# Patient Record
Sex: Male | Born: 1990 | Race: White | Hispanic: No | Marital: Married | Smoking: Current some day smoker
Health system: Southern US, Community
[De-identification: ages and names within clinical notes are randomized; demographics above are authoritative.]

## PROBLEM LIST (undated history)

## (undated) DIAGNOSIS — J45909 Unspecified asthma, uncomplicated: Secondary | ICD-10-CM

## (undated) DIAGNOSIS — F419 Anxiety disorder, unspecified: Secondary | ICD-10-CM

## (undated) DIAGNOSIS — R51 Headache: Secondary | ICD-10-CM

## (undated) DIAGNOSIS — R519 Headache, unspecified: Secondary | ICD-10-CM

## (undated) DIAGNOSIS — M549 Dorsalgia, unspecified: Secondary | ICD-10-CM

## (undated) HISTORY — DX: Anxiety disorder, unspecified: F41.9

## (undated) HISTORY — DX: Headache: R51

## (undated) HISTORY — DX: Unspecified asthma, uncomplicated: J45.909

## (undated) HISTORY — DX: Headache, unspecified: R51.9

---
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
## 2014-06-22 DIAGNOSIS — Z0000 Encounter for general adult medical examination without abnormal findings: Secondary | ICD-10-CM | POA: Insufficient documentation

## 2014-06-22 DIAGNOSIS — J309 Allergic rhinitis, unspecified: Secondary | ICD-10-CM | POA: Insufficient documentation

## 2014-08-25 ENCOUNTER — Ambulatory Visit (HOSPITAL_COMMUNITY): Payer: Self-pay | Admitting: Physician Assistant

## 2014-09-02 ENCOUNTER — Encounter (HOSPITAL_COMMUNITY): Payer: Self-pay | Admitting: Medical

## 2014-09-02 ENCOUNTER — Ambulatory Visit (INDEPENDENT_AMBULATORY_CARE_PROVIDER_SITE_OTHER): Payer: BLUE CROSS/BLUE SHIELD | Admitting: Medical

## 2014-09-02 VITALS — BP 120/70 | HR 71 | Ht 67.0 in | Wt 200.0 lb

## 2014-09-02 DIAGNOSIS — F900 Attention-deficit hyperactivity disorder, predominantly inattentive type: Secondary | ICD-10-CM

## 2014-09-02 MED ORDER — LISDEXAMFETAMINE DIMESYLATE 20 MG PO CAPS: 20.0000 mg | ORAL_CAPSULE | Freq: Every day | ORAL | Status: DC

## 2014-09-02 NOTE — Progress Notes (Signed)
Psychiatric Assessment Adult  Patient Identification:  Nicolas Cohen Date of Evaluation:  09/02/2014 Chief Complaint: "Not being able to focus" History of Chief Complaint:   Chief Complaint  Patient presents with  . Establish Care  . ADD    HPI 24 yo Hispanic male referred by Helene Kelp PA-C  [facility] after c/o problems with focus especially at work upon his annual wellness exam.Pt reports that problem began in grammar school and has gotten worse over last 10 years.His sister was tested in Middle School and was positive.His father is also + for ADD he reports.The problem is aggravated by tasks requiring attention to detail;tasks that are complicated and 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  Review of Systems  Constitutional Symptoms      Denies fever, chills, night sweats, weight loss, weight gain, and fatigue.   Eyes        Denies change in vision, eye pain, eye discharge, glasses, contact lenses, and eye surgery. Ear/Nose/Throat/Mouth .Has enviornmental allergies. HEARING TESTED ANNUALLY       Denies hearing loss/aids, change in hearing, ear pain, ear discharge, dizziness, frequent runny nose, frequent nose bleeds, sinus problems, sore throat, hoarseness, and tooth pain or bleeding.   Respiratory       Denies dry cough, productive cough, wheezing, shortness of breath, asthma, bronchitis, and emphysema/COPD.   Hx of asthma Cardiovascular       Denies murmurs, chest pain, and tires easily with exhertion.    Gastrointestinal       Denies stomach pain, nausea/vomiting, diarrhea, constipation, blood in bowel movements, and indigestion. Genitourniary       Denies painful urination, blood or discharge from penis, blood or discharge from vagina, kidney stones, and loss of urinary control. Neurological       Denies paralysis, seizures, and fainting/blackouts. Musculoskeletal       Complains of swelling.       Denies muscle pain, joint pain, joint stiffness, decreased range of motion, redness, muscle weakness, and gout.     Comments: Left Hand Skin       Denies bruising, unusual mles/lumps or sores, and hair/skin or nail changes.   Psych       Denies mood changes, temper/anger issues, anxiety/stress, speech problems, depression, and sleep problems. C/O inability to focus;fatigue at end of 12 hr work day  Physical Exam  General: Well groomed well nourished. HENT- normocephalic;ears,nose,throat no redness,lesins,discharge Neck-Normal thyroid.trchea midline Chest-Non teb nedr Lungs- Normal respirations Heart NSR Abdomen-Normal to inspection.Nontender Genitalia-Deferred Spine and extremeties-normal posture;normal ROM.Small subungual hematoma rt thumbnail Rectal-Deferred Neuro-C2-12 intact;Motor and sensory WNL Psychiatric-see SPE  Depressive Symptoms: PHQ9-0  (Hypo) Manic Symptoms:   Elevated Mood:  Negative Irritable Mood:  Negative Grandiosity:  Negative Distractibility:  Yes Labiality of Mood:  Negative Delusions:  Negative Hallucinations:  Negative Impulsivity:  Negative Sexually Inappropriate Behavior:  Negative Financial Extravagance:  Negative Flight of Ideas:  Negative  Anxiety Symptoms: Excessive Worry:  Negative Panic Symptoms:  Negative Agoraphobia:  Negative Obsessive Compulsive: Negative  Symptoms: None, Specific Phobias:  Negative Social Anxiety:  Negative  Psychotic Symptoms:  Hallucinations: Negative None Delusions:  Negative Paranoia:  Negative   Ideas of Reference:  Negative  PTSD Symptoms: Ever had a traumatic exposure:  Negative Had a traumatic exposure in the last month:  Negative Re-experiencing: Negative None Hypervigilance:  Negative Hyperarousal: Negative None Avoidance: Negative None  Traumatic Brain Injury: Negative NA  Past Psychiatric History: Diagnosis: None-?ADD in grammar school but  never tested  Hospitalizations: None  Outpatient  Care: NA  Substance Abuse Care: None  Self-Mutilation: None  Suicidal Attempts: None  Violent Behaviors:None   Past Medical History:   Past Medical History  Diagnosis Date  . Anxiety   . Asthma   . Headache    History of Loss of Consciousness:  Negative Seizure History:  Negative Cardiac History:  Negative Allergies:  No Known Allergies Current Medications:  Current Outpatient Prescriptions  Medication Sig Dispense Refill  . fexofenadine (ALLEGRA) 180 MG tablet Take 180 mg by mouth.    Marland Kitchen HYDROcodone-acetaminophen (NORCO/VICODIN) 5-325 MG per tablet Take 1 tablet by mouth.    . methylPREDNISolone (MEDROL DOSEPAK) 4 MG TBPK tablet follow package directions    . lisdexamfetamine (VYVANSE) 20 MG capsule Take 1 capsule (20 mg total) by mouth daily. 30 capsule 0   No current facility-administered medications for this visit.    Previous Psychotropic Medications:  Medication Dose   None  na                     Substance Abuse History in the last 12 months:None Substance Age of 1st Use Last Use Amount Specific Type  Nicotine  15  Quit       Alcohol   20  July 4  4  beers  Cannabis 0     Opiates 0     Cocaine 0     Methamphetamines 0     LSD 0     Ecstasy 0     Benzodiazepines 0     Caffeine      Inhalants 0     Others: 0                         Medical Consequences of Substance Abuse: NA  Legal Consequences of Substance Abuse: NA  Family Consequences of Substance Abuse: NA  Blackouts:  Negative DT's:  Negative Withdrawal Symptoms:  Negative None  Social History: Current Place of Residence: [place] Place of Birth: [place] Family Members: Lives with Wife,2 daughters-Sister 34 and 1/2 brother 31 Mother,father living in [place] Marital Status:  Married Children: 2  Sons: na  Daughters: 2- 5 and 3 yrs Relationships: as above Education:  Management consultant Problems/Performance: Focus-never tested Jaci Carrel took meds Religious  Beliefs/Practices: Does not consider himself religious History of Abuse: none Occupational Experiences;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 Legal History: NA Hobbies/Interests: Spending time with family at [facility] History:   Family History  Problem Relation Age of Onset  . ADD / ADHD Father   . ADD / ADHD Sister     Mental Status Examination/Evaluation: Objective:  Appearance: Neat and Well Groomed  Eye Contact::  Good  Speech:  Clear and Coherent  Volume:  Normal  Mood:  Euthymic  Affect:  Constricted  Thought Process:  Coherent  Orientation:  Full (Time, Place, and Person)  Thought Content:  WDL  Suicidal Thoughts:  No  Homicidal Thoughts:  No  Judgement:  Good  Insight:  Good  Psychomotor Activity:  Normal  Akathisia:  NA  Handed:  Right  AIMS (if indicated):  NA  Assets:  Communication Skills Desire for Improvement Financial Resources/Insurance Housing Physical Health Resilience Social Support    Laboratory/X-Ray Psychological Evaluation(s)   Per PCP  ADD screening positive   Assessment:  DSM 5 ADD inattentive type  AXIS I See  DSM 5  AXIS II Deferred  AXIS III Past Medical History  Diagnosis Date  . Anxiety   . Asthma   . Headache      AXIS IV occupational problems and other psychosocial or environmental problems  AXIS V 41-50 serious symptoms   Treatment Plan/Recommendations:  Plan of Care: Discussed treatment options and pt elected to try Vyvanse  Laboratory:  Deferred to PCP  Psychotherapy: NA  Medications: see list  Routine PRN Medications:  Negative  Consultations: NA  Safety Concerns:  None  Other:  NA    Maryjean Mornharles Akasha Melena, PA-C 7/7/20164:49 PM

## 2014-09-30 ENCOUNTER — Ambulatory Visit (INDEPENDENT_AMBULATORY_CARE_PROVIDER_SITE_OTHER): Payer: BLUE CROSS/BLUE SHIELD | Admitting: Medical

## 2014-09-30 ENCOUNTER — Encounter (HOSPITAL_COMMUNITY): Payer: Self-pay | Admitting: Medical

## 2014-09-30 VITALS — BP 124/70 | HR 72 | Ht 67.0 in | Wt 200.0 lb

## 2014-09-30 DIAGNOSIS — F900 Attention-deficit hyperactivity disorder, predominantly inattentive type: Secondary | ICD-10-CM | POA: Diagnosis not present

## 2014-09-30 MED ORDER — LISDEXAMFETAMINE DIMESYLATE 60 MG PO CAPS: 50.0000 mg | ORAL_CAPSULE | Freq: Every day | ORAL | Status: DC

## 2014-09-30 NOTE — Progress Notes (Signed)
   [facility] Follow-up Outpatient Visit  Nicolas Cohen January 17, 1991  Date: 09/29/2024   Subjective: Med check for or new ADD rx Vyvanse Pt reports had to take 2 doses and that only lasted 4 hours. Did not affect appetite or sleep  Filed Vitals:   09/30/14 1605  BP: 124/70  Pulse: 72    Mental Status Examination  Appearance: In from work Alert: Yes Attention: good  Cooperative: Yes Eye Contact: Good Speech: WNL Psychomotor Activity: Normal Memory/Concentration: Intact Oriented: person, place, time/date and situation Mood: Euthymic Affect: Congruent Thought Processes and Associations: Intact and Logical Fund of Knowledge: Good Thought Content: Normal Insight: Good Judgement: Good  Diagnosis: ADD partial response  Treatment Plan: Increase VyVanse to 60 mg-FU 1 month Call if problems  Maryjean Morn, PA-C

## 2014-11-04 ENCOUNTER — Ambulatory Visit (INDEPENDENT_AMBULATORY_CARE_PROVIDER_SITE_OTHER): Payer: BLUE CROSS/BLUE SHIELD | Admitting: Medical

## 2014-11-04 ENCOUNTER — Encounter (HOSPITAL_COMMUNITY): Payer: Self-pay | Admitting: Medical

## 2014-11-04 DIAGNOSIS — F900 Attention-deficit hyperactivity disorder, predominantly inattentive type: Secondary | ICD-10-CM | POA: Diagnosis not present

## 2014-11-04 MED ORDER — LISDEXAMFETAMINE DIMESYLATE 70 MG PO CAPS: 50.0000 mg | ORAL_CAPSULE | Freq: Every day | ORAL | Status: DC

## 2014-11-04 NOTE — Progress Notes (Signed)
   [facility] Follow-up Outpatient Visit  Nicolas Cohen November 16, 1990  Date: 11/04/2014   Subjective: Med check for or new ADD rx Vyvanse Pt reports HE NEEDS 1 MORE INCREASE TO REACH GOAL. No effect on appetite or sleep. No tremor;irritability;tics HPI 24 yo Hispanic male referred by Helene Kelp PA-C  [facility] after c/o problems with focus especially at work upon his annual wellness exam.Pt reports that problem began in grammar school and has gotten worse over last 10 years.His sister was tested in Middle School and was positive.His father is also + for ADD he reports.The problem is aggravated by tasks requiring attention to detail;tasks that are complicated and 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   There were no vitals filed for this visit.  Mental Status Examination  Appearance: In from work Alert: Yes Attention: good  Cooperative: Yes Eye Contact: Good Speech: WNL Psychomotor Activity: Normal Memory/Concentration: Intact Oriented: person, place, time/date and situation Mood: Euthymic Affect: Congruent Thought Processes and Associations: Intact and Logical Fund of Knowledge: Good Thought Content: Normal Insight: Good Judgement: Good  Diagnosis: ADD partial response  Treatment Plan: Increase VyVanse to 70 mg-FU 1 month Call if problems  Maryjean Morn, PA-C

## 2014-12-09 ENCOUNTER — Ambulatory Visit (INDEPENDENT_AMBULATORY_CARE_PROVIDER_SITE_OTHER): Payer: BLUE CROSS/BLUE SHIELD | Admitting: Medical

## 2014-12-09 ENCOUNTER — Encounter (HOSPITAL_COMMUNITY): Payer: Self-pay | Admitting: Medical

## 2014-12-09 VITALS — BP 126/72 | HR 86 | Ht 67.0 in | Wt 185.0 lb

## 2014-12-09 DIAGNOSIS — F900 Attention-deficit hyperactivity disorder, predominantly inattentive type: Secondary | ICD-10-CM

## 2014-12-09 MED ORDER — LISDEXAMFETAMINE DIMESYLATE 70 MG PO CAPS: 50.0000 mg | ORAL_CAPSULE | Freq: Every day | ORAL | Status: DC

## 2014-12-09 MED ORDER — LISDEXAMFETAMINE DIMESYLATE 70 MG PO CAPS: 70.0000 mg | ORAL_CAPSULE | Freq: Every day | ORAL | Status: DC

## 2014-12-09 NOTE — Progress Notes (Signed)
   [facility]  [name] [birth year]  Date: 11/04/2014   Subjective: Vyvanse 70 mg is working HPI 24 yo Hispanic male referred by Helene KelpAndrew Maier PA-C  [facility] after c/o problems with focus especially at work upon his annual wellness exam.Pt reports that problem began in grammar school and has gotten worse over last 10 years.His sister was tested in Middle School and was positive.His father is also + for ADD he reports.The problem is aggravated by tasks requiring attention to detail;tasks that are complicated and 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   Filed Vitals:   12/09/14 1540  BP: 126/72  Pulse: 86    Mental Status Examination  Appearance: In from work Alert: Yes Attention: good  Cooperative: Yes Eye Contact: Good Speech: WNL Psychomotor Activity: Normal Memory/Concentration: Intact Oriented: person, place, time/date and situation Mood: Euthymic Affect: Congruent Thought Processes and Associations: Intact and Logical Fund of Knowledge: Good Thought Content: Normal Insight: Good Judgement: Good  Diagnosis: ADD partial response  Treatment Plan: RX VyVanse to 70 mg-FU 3 months  Maryjean Mornharles Laresha Bacorn, PA-C

## 2015-03-14 ENCOUNTER — Ambulatory Visit (HOSPITAL_COMMUNITY): Payer: BLUE CROSS/BLUE SHIELD | Admitting: Medical

## 2015-03-17 ENCOUNTER — Ambulatory Visit (INDEPENDENT_AMBULATORY_CARE_PROVIDER_SITE_OTHER): Payer: BLUE CROSS/BLUE SHIELD | Admitting: Medical

## 2015-03-17 ENCOUNTER — Encounter (HOSPITAL_COMMUNITY): Payer: Self-pay | Admitting: Medical

## 2015-03-17 VITALS — BP 108/62 | HR 56 | Ht 67.0 in | Wt 170.0 lb

## 2015-03-17 DIAGNOSIS — F900 Attention-deficit hyperactivity disorder, predominantly inattentive type: Secondary | ICD-10-CM

## 2015-03-17 MED ORDER — LISDEXAMFETAMINE DIMESYLATE 70 MG PO CAPS: 70.0000 mg | ORAL_CAPSULE | Freq: Every day | ORAL | Status: DC

## 2015-03-17 MED ORDER — LISDEXAMFETAMINE DIMESYLATE 70 MG PO CAPS: 50.0000 mg | ORAL_CAPSULE | Freq: Every day | ORAL | Status: DC

## 2015-03-17 NOTE — Progress Notes (Signed)
   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   Filed Vitals:   03/17/15 1035  BP: 108/62  Pulse: 56    Mental Status Examination  Appearance: In from work Alert: Yes Attention: good  Cooperative: Yes Eye Contact: Good Speech: WNL Psychomotor Activity: Normal Memory/Concentration: Intact Oriented: person, place, time/date and situation Mood: Euthymic Affect: Congruent Thought Processes and Associations: Intact and Logical Fund of Knowledge: Good Thought Content: Normal Insight: Good Judgement: Good  Diagnosis: ADD at Goal with Vyvanse 70 mg QD  Treatment Plan: Continue VyVanse 70 mg-FU 3 months  Maryjean Morn, PA-C

## 2015-06-16 ENCOUNTER — Ambulatory Visit (INDEPENDENT_AMBULATORY_CARE_PROVIDER_SITE_OTHER): Payer: BLUE CROSS/BLUE SHIELD | Admitting: Medical

## 2015-06-16 ENCOUNTER — Encounter (HOSPITAL_COMMUNITY): Payer: Self-pay | Admitting: Medical

## 2015-06-16 VITALS — BP 116/70 | HR 63 | Ht 66.0 in | Wt 168.0 lb

## 2015-06-16 DIAGNOSIS — F900 Attention-deficit hyperactivity disorder, predominantly inattentive type: Secondary | ICD-10-CM | POA: Diagnosis not present

## 2015-06-16 MED ORDER — LISDEXAMFETAMINE DIMESYLATE 70 MG PO CAPS: 50.0000 mg | ORAL_CAPSULE | Freq: Every day | ORAL | Status: DC

## 2015-06-16 MED ORDER — LISDEXAMFETAMINE DIMESYLATE 70 MG PO CAPS: 70.0000 mg | ORAL_CAPSULE | Freq: Every day | ORAL | Status: DC

## 2015-06-16 NOTE — Progress Notes (Signed)
   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 after c/o problems with focus especially at work upon his annual wellness exam.Pt reports that problem began in grammar school and has gotten worse over last 10 years.His sister was tested in Middle School and was positive.His father is also + for ADD he reports.The problem is aggravated by tasks requiring attention to detail;tasks that are complicated and 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He denies any problems with sleep;appetite;mood;abuse.   Filed Vitals:   06/16/15 1054  BP: 116/70  Pulse: 63    Mental Status Examination  Appearance: In from work Alert: Yes Attention: good  Cooperative: Yes Eye Contact: Good Speech: WNL Psychomotor Activity: Normal Memory/Concentration: Intact Oriented: person, place, time/date and situation Mood: Euthymic Affect: Congruent Thought Processes and Associations: Intact and Logical Fund of Knowledge: Good Thought Content: Normal Insight: Good Judgement: Good  Diagnosis: ADD at Goal with Vyvanse 70 mg QD  Treatment Plan: Continue VyVanse 70 mg-FU 3 months  Maryjean Mornharles Sayla Golonka, PA-C

## 2015-09-08 ENCOUNTER — Ambulatory Visit (HOSPITAL_COMMUNITY): Payer: Self-pay | Admitting: Medical

## 2015-09-29 ENCOUNTER — Ambulatory Visit (INDEPENDENT_AMBULATORY_CARE_PROVIDER_SITE_OTHER): Payer: BLUE CROSS/BLUE SHIELD | Admitting: Medical

## 2015-09-29 VITALS — BP 121/73 | HR 58 | Ht 67.0 in | Wt 165.0 lb

## 2015-09-29 DIAGNOSIS — F900 Attention-deficit hyperactivity disorder, predominantly inattentive type: Secondary | ICD-10-CM | POA: Diagnosis not present

## 2015-09-29 MED ORDER — LISDEXAMFETAMINE DIMESYLATE 70 MG PO CAPS: 70.0000 mg | ORAL_CAPSULE | Freq: Every day | ORAL | 0 refills | Status: DC

## 2015-09-29 MED ORDER — LISDEXAMFETAMINE DIMESYLATE 70 MG PO CAPS: 50.0000 mg | ORAL_CAPSULE | Freq: Every day | ORAL | 0 refills | Status: DC

## 2015-10-03 ENCOUNTER — Encounter (HOSPITAL_COMMUNITY): Payer: Self-pay | Admitting: Medical

## 2015-10-03 NOTE — Progress Notes (Addendum)
   [facility]  Nicolas BreedMichael Cohen Feb 14, 1991  Date:01/05/2016 Subjective: "Im doing well-incraese of medicne seems to work "  HPI  25 yo Hispanic male referred by Helene KelpAndrew Maier PA-C  [facility] after c/o problemswith focus especially at work upon his annual wellness exam.Pt has been on treatment since July 2016 and required adjustment of Vyvanse in April of this year .Since then he has been stable without any side effects inclusing insomnia and wgt loss.  Initial visit hx: At original consult pt reports that problem began in grammar school and has gotten worse over last 10 years.His sister was tested in Middle School and was positive.His father is also + for ADD he reports.The problem is aggravated by tasks requiring attention to detail;tasks that are complicated and 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He denies any problems with sleep;appetite;mood;abuse.Has noticed less mood change without loss of attention efficacy.   Wgt 165 lbs Hgt 5'7"  Review of SystRevieems: Psychiatric: Agitation: Negative Hallucination: Negative Depressed Mood: Negative Insomnia: Negative Hypersomnia: Negative Altered Concentration: ADD RX Vyvanse at goal Feels Worthless: Negative Grandiose Ideas: Negative Belief In Special Powers: Negative New/Increased Substance Abuse: Negative Compulsions: Negative   NCCSRS negative  Neurologic: Headache: Negative Seizure: Negative Paresthesias: Negative   Mental Status Examination  Appearance: In from work Alert: Yes Attention: good  Cooperative: Yes Eye Contact: Good Speech: WNL Psychomotor Activity: Normal Memory/Concentration: Intact Oriented: person, place, time/date and situation Mood: Euthymic Affect: Congruent Thought Processes and Associations: Intact and Logical Fund of Knowledge: Good Thought Content: Normal Insight: Good Judgement:  Good   Musculoskeletal: Strength & Muscle Tone: within normal limits Gait & Station: normal Patient leans: N/A  Diagnosis: ADD at Goal with Vyvanse 70 mg QD  Treatment Plan: Continue VyVanse 70 mg-FU 3 months  Maryjean Mornharles Morrie Daywalt, PA-C

## 2016-01-05 ENCOUNTER — Ambulatory Visit (INDEPENDENT_AMBULATORY_CARE_PROVIDER_SITE_OTHER): Payer: BLUE CROSS/BLUE SHIELD | Admitting: Medical

## 2016-01-05 ENCOUNTER — Encounter (HOSPITAL_COMMUNITY): Payer: Self-pay | Admitting: Medical

## 2016-01-05 VITALS — BP 126/80 | HR 100 | Ht 67.0 in | Wt 164.0 lb

## 2016-01-05 DIAGNOSIS — F900 Attention-deficit hyperactivity disorder, predominantly inattentive type: Secondary | ICD-10-CM | POA: Diagnosis not present

## 2016-01-05 MED ORDER — LISDEXAMFETAMINE DIMESYLATE 70 MG PO CAPS: 70.0000 mg | ORAL_CAPSULE | Freq: Every day | ORAL | 0 refills | Status: DC

## 2016-01-05 NOTE — Progress Notes (Addendum)
   [facility]  Park BreedMichael Henn[place] 05-22-1990  Date: 06/16/2015 Subjective: "Im doing well-the medicne doesnt seem to work the same (Mood feeling wise) Is that m normal?  HPI 25 yo Hispanic male referred by Helene KelpAndrew Maier PA-C  [facility] after c/o problems with focus especially at work upon his annual wellness exam.Pt reports that problem began in grammar school and has gotten worse over last 10 years.His sister was tested in Middle School and was positive.His father is also + for ADD he reports.The problem is aggravated by tasks requiring attention to detail;tasks that are complicated and 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He denies any problems with sleep;appetite;mood;abuse.Has noticed less mood change without loss of attention efficacy.  BP 121/73 P 58 Wgt 165 lbs Hgt 5'7"  Mental Status Examination  Appearance: In from work Alert: Yes Attention: good  Cooperative: Yes Eye Contact: Good Speech: WNL Psychomotor Activity: Normal Memory/Concentration: Intact Oriented: person, place, time/date and situation Mood: Euthymic Affect: Congruent Thought Processes and Associations: Intact and Logical Fund of Knowledge: Good Thought Content: Normal Insight: Good Judgement: Good  Diagnosis: ADD at Goal with Vyvanse 70 mg QD  Treatment Plan: Continue VyVanse 70 mg-FU 3 months  Maryjean Mornharles Kober, PA-C  [facility]  Park BreedMichael Pekala 05-22-1990  Date: 06/16/2015 Subjective: "Im doing well-the medicne doesnt seem to work the same (Mood feeling wise) Is that m normal?  HPI 25 yo Hispanic male referred by Helene KelpAndrew Maier PA-C  [facility] after c/o problems with focus especially at work upon his annual wellness exam.Pt reports that problem began in grammar school and has gotten worse over last 10 years.His sister was  tested in Middle School and was positive.His father is also + for ADD he reports.The problem is aggravated by tasks requiring attention to detail;tasks that are complicated and 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He denies any problems with sleep;appetite;mood;abuse.Has noticed less mood change without loss of attention efficacy.  BP 121/73 P 58 Wgt 165 lbs Hgt 5'7"  Mental Status Examination  Appearance: In from work Alert: Yes Attention: good  Cooperative: Yes Eye Contact: Good Speech: WNL Psychomotor Activity: Normal Memory/Concentration: Intact Oriented: person, place, time/date and situation Mood: Euthymic Affect: Congruent Thought Processes and Associations: Intact and Logical Fund of Knowledge: Good Thought Content: Normal Insight: Good Judgement: Good  Diagnosis: ADD at Goal with Vyvanse 70 mg QD  Treatment Plan: Continue VyVanse 70 mg-FU 3 months  Maryjean Mornharles Kober, PA-C

## 2016-03-29 ENCOUNTER — Ambulatory Visit (INDEPENDENT_AMBULATORY_CARE_PROVIDER_SITE_OTHER): Payer: Self-pay | Admitting: Medical

## 2016-03-29 ENCOUNTER — Encounter (HOSPITAL_COMMUNITY): Payer: Self-pay | Admitting: Medical

## 2016-03-29 DIAGNOSIS — Z5329 Procedure and treatment not carried out because of patient's decision for other reasons: Secondary | ICD-10-CM

## 2016-03-29 NOTE — Progress Notes (Signed)
No show/no call.

## 2016-05-10 ENCOUNTER — Ambulatory Visit (INDEPENDENT_AMBULATORY_CARE_PROVIDER_SITE_OTHER): Payer: BLUE CROSS/BLUE SHIELD | Admitting: Medical

## 2016-05-10 ENCOUNTER — Encounter (HOSPITAL_COMMUNITY): Payer: Self-pay | Admitting: Medical

## 2016-05-10 VITALS — BP 124/72 | HR 87 | Resp 16 | Ht 67.0 in | Wt 184.0 lb

## 2016-05-10 DIAGNOSIS — F900 Attention-deficit hyperactivity disorder, predominantly inattentive type: Secondary | ICD-10-CM | POA: Diagnosis not present

## 2016-05-10 MED ORDER — LISDEXAMFETAMINE DIMESYLATE 70 MG PO CAPS: 70.0000 mg | ORAL_CAPSULE | Freq: Every day | ORAL | 0 refills | Status: DC

## 2016-05-10 NOTE — Progress Notes (Signed)
   [facility]  Nicolas Cohen 1990/09/26  Date:3/15 /2018 Subjective: "Im doing well-the medicne is working."  HPI Pt here for 3 month med management visit for ADD Rx Vyvanse with good response. Continues to do well with no untoward side effects including anorexia;wgt loss;sleeep disturbance. 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  Initial visit history: 26 yo Hispanic male referred by Helene KelpAndrew Maier PA-C  [facility] after c/o problems with focus especially at work upon his annual wellness exam.Pt reports that problem began in grammar school and has gotten worse over last 10 years.His sister was tested in Middle School and was positive. 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  Psychiatric Exam Vitals: BP 124/72 P-87 Wgt 184lbs Hgt 5'7" BMI  Review of Systems: Psychiatric: Agitation: Negative Hallucination: Negative Depressed Mood: Negative Insomnia: Negative Hypersomnia: Negative Altered Concentration: NONE ON MEDICATION Feels Worthless: Negative Grandiose Ideas: Negative Belief In Special Powers: Negative New/Increased Substance Abuse: Negative Compulsions: Negative  Neurologic: Headache: No Seizure: No Paresthesias: No   Mental Status Examination  Appearance: In from work Alert: Yes Attention: good  Cooperative: Yes Eye Contact: Good Speech: WNL Psychomotor Activity: Normal Memory/Concentration: Intact Oriented: person, place, time/date and situation Mood: Euthymic Affect: Congruent Thought Processes and Associations: Intact and Logical Fund of Knowledge: Good Thought Content:  Normal Insight: Good Judgement: Good   NCCSRS negative  Diagnosis: ADD at Goal with Vyvanse 70 mg QD  Treatment Plan: Continue VyVanse 70 mg-FU 3 months  Maryjean Mornharles Sasha Rueth, PA-C  [facility]  Nicolas Cohen 1990/09/26  Date: 06/16/2015 Subjective: "Im doing well-the medicne doesnt seem to work the same (Mood feeling wise) Is that m normal?  HPI 26 yo Hispanic male referred by Helene KelpAndrew Maier PA-C  [facility] after c/o problems with focus especially at work upon his annual wellness exam.Pt reports that problem began in grammar school and has gotten worse over last 10 years.His sister was tested in Middle School and was positive.His father is also + for ADD he reports.The problem is aggravated by tasks requiring attention to detail;tasks that are complicated and 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He denies any problems with sleep;appetite;mood;abuse.Has noticed less mood change without loss of attention efficacy.  BP 121/73 P 58 Wgt 165 lbs Hgt 5'7"  Mental Status Examination  Appearance: In from work Alert: Yes Attention: good  Cooperative: Yes Eye Contact: Good Speech: WNL Psychomotor Activity: Normal Memory/Concentration: Intact Oriented: person, place, time/date and situation Mood: Euthymic Affect: Congruent Thought Processes and Associations: Intact and Logical Fund of Knowledge: Good Thought Content: Normal Insight: Good Judgement: Good  Diagnosis: ADD at Goal with Vyvanse 70 mg QD  Treatment Plan: Continue VyVanse 70 mg-FU 3 months  Maryjean Mornharles Jazz Biddy, PA-C

## 2016-08-09 ENCOUNTER — Ambulatory Visit (INDEPENDENT_AMBULATORY_CARE_PROVIDER_SITE_OTHER): Payer: BLUE CROSS/BLUE SHIELD | Admitting: Medical

## 2016-08-09 ENCOUNTER — Encounter (HOSPITAL_COMMUNITY): Payer: Self-pay | Admitting: Medical

## 2016-08-09 DIAGNOSIS — F900 Attention-deficit hyperactivity disorder, predominantly inattentive type: Secondary | ICD-10-CM

## 2016-08-09 DIAGNOSIS — Z5329 Procedure and treatment not carried out because of patient's decision for other reasons: Secondary | ICD-10-CM

## 2016-08-09 NOTE — Progress Notes (Signed)
No show/no call.

## 2016-09-06 ENCOUNTER — Encounter (HOSPITAL_COMMUNITY): Payer: Self-pay | Admitting: Medical

## 2016-09-06 ENCOUNTER — Ambulatory Visit (INDEPENDENT_AMBULATORY_CARE_PROVIDER_SITE_OTHER): Payer: BLUE CROSS/BLUE SHIELD | Admitting: Medical

## 2016-09-06 VITALS — BP 126/72 | HR 74 | Resp 16 | Ht 67.0 in | Wt 186.0 lb

## 2016-09-06 DIAGNOSIS — F900 Attention-deficit hyperactivity disorder, predominantly inattentive type: Secondary | ICD-10-CM

## 2016-09-06 MED ORDER — LISDEXAMFETAMINE DIMESYLATE 70 MG PO CAPS: 70.0000 mg | ORAL_CAPSULE | Freq: Every day | ORAL | 0 refills | Status: DC

## 2016-09-06 NOTE — Progress Notes (Signed)
No show/no call  [facility]  Nicolas Cohen 1990/03/23  Date:3/15 /2018 Subjective: "Im doing well-the medicne is working."  HPI Pt here for 3 month med management visit for ADD Rx Vyvanse with good response. Continues to do well with no untoward side effects including anorexia;wgt loss;sleeep disturbance. 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  Initial visit history: 26 yo Hispanic male referred by Helene KelpAndrew Maier PA-C  [facility] after c/o problems with focus especially at work upon his annual wellness exam.Pt reports that problem began in grammar school and has gotten worse over last 10 years.His sister was tested in Middle School and was positive. 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  Psychiatric Exam Vitals: BP 124/72 P-87 Wgt 184lbs Hgt 5'7" BMI  Review of Systems: Psychiatric: Agitation: Negative Hallucination: Negative Depressed Mood: Negative Insomnia: Negative Hypersomnia: Negative Altered Concentration: NONE ON MEDICATION Feels Worthless: Negative Grandiose Ideas: Negative Belief In Special Powers: Negative New/Increased Substance Abuse: Negative Compulsions: Negative  Neurologic: Headache: No Seizure: No Paresthesias: No   Mental Status Examination  Appearance: In from work Alert: Yes Attention: good  Cooperative: Yes Eye Contact: Good Speech: WNL Psychomotor Activity: Normal Memory/Concentration: Intact Oriented: person, place, time/date and situation Mood: Euthymic Affect: Congruent Thought Processes and Associations: Intact and Logical Fund of Knowledge: Good Thought  Content: Normal Insight: Good Judgement: Good   NCCSRS negative  Diagnosis: ADD at Goal with Vyvanse 70 mg QD  Treatment Plan: Continue VyVanse 70 mg-FU 3 months  Maryjean Mornharles Felesia Stahlecker, PA-C  [facility]  Nicolas Cohen 1990/03/23  Date: 09/06/2016 4:45 pm Subjective: "Im doing well-the medicne doesnt seem to work the same (Mood feeling wise) Is that m normal?  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 He denies any problems with sleep;appetite;mood;abuse.Has noticed less mood change without loss of attention efficacy.  Review of Systems: Psychiatric: Agitation: Negative Hallucination: Negative Depressed Mood: Negative Insomnia: Negative Hypersomnia: Negative Altered Concentration: Without medication-has been out since missed visit 2 weeks ago Feels Worthless: Negative Grandiose Ideas: Negative Belief In Special Powers: Negative New/Increased Substance Abuse: Negative Compulsions: Negative  Neurologic: Headache: No Seizure: No Paresthesias: Negative     BP 121/73 P 58 Wgt 165 lbs Hgt 5'7"  Mental Status Examination  Appearance: In from work Alert: Yes Attention: good  Cooperative: Yes Eye Contact: Good Speech: WNL Psychomotor Activity: Normal Memory/Concentration: Intact Oriented: person, place, time/date and situation Mood: Euthymic Affect: Congruent Thought Processes and Associations: Intact and Logical Fund of Knowledge:  Good Thought Content: Normal Insight: Good Judgement: Good   Musculoskeletal: Strength & Muscle Tone: within normal limits Gait & Station: normal Patient leans: N/A  NCCSRS-as expected  Diagnosis: ADD at Goal with Vyvanse 70 mg QD  Treatment Plan:  Continue VyVanse 70 mg-FU 3 months Check UDS  Maryjean Morn, PA-C

## 2016-11-29 ENCOUNTER — Encounter (HOSPITAL_COMMUNITY): Payer: Self-pay | Admitting: Medical

## 2016-11-29 ENCOUNTER — Ambulatory Visit (INDEPENDENT_AMBULATORY_CARE_PROVIDER_SITE_OTHER): Payer: BLUE CROSS/BLUE SHIELD | Admitting: Medical

## 2016-11-29 DIAGNOSIS — F900 Attention-deficit hyperactivity disorder, predominantly inattentive type: Secondary | ICD-10-CM

## 2016-11-29 DIAGNOSIS — Z79899 Other long term (current) drug therapy: Secondary | ICD-10-CM | POA: Diagnosis not present

## 2016-11-29 MED ORDER — LISDEXAMFETAMINE DIMESYLATE 70 MG PO CAPS: 70.0000 mg | ORAL_CAPSULE | Freq: Every day | ORAL | 0 refills | Status: DC

## 2016-11-29 NOTE — Progress Notes (Signed)
  [facility] Follow-up Outpatient Visit  Nicolas Cohen 1990-03-13  Date: 11/29/2016 4:50pm Subjective:HPI "Im doing well-the medicne continues to work."   FU for adult ADD  .26 yo Hispanic male referred by Helene Kelp PA-C  [facility]Im doing well-the medicne continues to work."  [employer] after c/o problems with focus especially at work upon his annual wellness exam.Pt reports that problem began in grammar school and has gotten worse over last 10 years.His sister was tested in Middle School and was positive.His father is also + for ADD he reports.The problem is aggravated by tasks requiring attention to detail;tasks that are complicated and 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 He denies any problems with sleep;appetite;mood;abuse.Has noticed less mood change without loss of attention efficacy.  Review of Systems: Psychiatric: Agitation: Negative Hallucination: Negative Depressed Mood: Negative Insomnia: Negative Hypersomnia: Negative Altered Concentration: Without medication-has been out since missed visit 2 weeks ago Feels Worthless: Negative Grandiose Ideas: Negative Belief In Special Powers: Negative New/Increased Substance Abuse: Negative Compulsions: Negative  Neurologic: Headache: No Seizure: No Paresthesias: Negative     BP 121/73 P 58 Wgt 165 lbs Hgt 5'7"  Mental Status Examination  Appearance: In from work Alert: Yes Attention: good  Cooperative: Yes Eye Contact: Good Speech: WNL Psychomotor Activity: Normal Memory/Concentration: Intact Oriented: person, place, time/date and situation Mood: Euthymic Affect: Congruent Thought Processes and Associations: Intact and Logical Fund of Knowledge: Good Thought Content: Normal Insight: Good Judgement: Good   Musculoskeletal: Strength & Muscle Tone: within normal limits Gait & Station: normal Patient leans: N/A  NCCSRS-as  expected  Diagnosis: ADD at Goal with Vyvanse 70 mg QD  Treatment Plan: Continue VyVanse 70 mg-FU 3 months Check UDS  Maryjean Morn, PA-C

## 2017-02-28 ENCOUNTER — Encounter (HOSPITAL_COMMUNITY): Payer: Self-pay | Admitting: Medical

## 2017-02-28 ENCOUNTER — Ambulatory Visit (INDEPENDENT_AMBULATORY_CARE_PROVIDER_SITE_OTHER): Payer: BLUE CROSS/BLUE SHIELD | Admitting: Medical

## 2017-02-28 DIAGNOSIS — F900 Attention-deficit hyperactivity disorder, predominantly inattentive type: Secondary | ICD-10-CM

## 2017-02-28 DIAGNOSIS — Z79899 Other long term (current) drug therapy: Secondary | ICD-10-CM

## 2017-02-28 DIAGNOSIS — Z5329 Procedure and treatment not carried out because of patient's decision for other reasons: Secondary | ICD-10-CM

## 2017-02-28 NOTE — Progress Notes (Signed)
No Show No Call for FU 

## 2019-04-13 ENCOUNTER — Other Ambulatory Visit: Payer: Self-pay

## 2019-04-13 ENCOUNTER — Emergency Department (INDEPENDENT_AMBULATORY_CARE_PROVIDER_SITE_OTHER)
Admission: EM | Admit: 2019-04-13 | Discharge: 2019-04-13 | Disposition: A | Discharge status: Home / Self Care | Payer: BC Managed Care – PPO | Source: Home / Self Care

## 2019-04-13 DIAGNOSIS — M5432 Sciatica, left side: Secondary | ICD-10-CM

## 2019-04-13 DIAGNOSIS — M5431 Sciatica, right side: Secondary | ICD-10-CM

## 2019-04-13 HISTORY — DX: Dorsalgia, unspecified: M54.9

## 2019-04-13 MED ORDER — KETOROLAC TROMETHAMINE 60 MG/2ML IM SOLN
60.0000 mg | Freq: Once | INTRAMUSCULAR | Status: AC
  Administered 2019-04-13: 12:00:00 60 mg via INTRAMUSCULAR

## 2019-04-13 MED ORDER — CYCLOBENZAPRINE HCL 10 MG PO TABS: 10.0000 mg | ORAL_TABLET | Freq: Two times a day (BID) | ORAL | 0 refills | Status: DC | PRN

## 2019-04-13 MED ORDER — NAPROXEN 500 MG PO TABS: 500.0000 mg | ORAL_TABLET | Freq: Two times a day (BID) | ORAL | 0 refills | Status: DC

## 2019-04-13 MED ORDER — PREDNISONE 50 MG PO TABS: 50.0000 mg | ORAL_TABLET | Freq: Every day | ORAL | 0 refills | Status: AC

## 2019-04-13 MED ORDER — METHYLPREDNISOLONE SODIUM SUCC 125 MG IJ SOLR
125.0000 mg | Freq: Once | INTRAMUSCULAR | Status: AC
  Administered 2019-04-13: 12:00:00 125 mg via INTRAMUSCULAR

## 2019-04-13 NOTE — Discharge Instructions (Signed)
If symptoms persist or do not improve with current treatment follow-up with our sports medicine specialist at [facility] primary care and sports medicine next-door for further evaluation and work-up of symptoms.

## 2019-04-13 NOTE — ED Triage Notes (Signed)
Woke up Saturday morning with lower right back pain.  Has HX of back pain.  Usually resolves in a couple of days.  Has not resolved.  Shooting pain into both legs down to knees.

## 2019-04-13 NOTE — ED Provider Notes (Addendum)
[facility]    CSN: [id number] Arrival date & time: 04/13/19  1041      History   Chief Complaint Chief Complaint  Patient presents with  . Back Pain    HPI Shalik Sanfilippo is a 29 y.o. male.   HPI  Kaeden Mester presents with symptoms of low back pain with sciatica (bilaterally >right side) Onset of symptoms: 2 days ago Symptoms include: sharp radiating pain from lumbosacral region down into bilateral buttocks and bilateral legs. Symptoms are currently moderate-severe. Worsen by positional changes and walking. History of chronic low back pain. Previously prescribed prednisone, flexeril and naproxen in the past with resolution of symptoms. Denies any recent injury, pain with urination, loss of bladder or bowel control. Past Medical History:  Diagnosis Date  . Anxiety   . Asthma   . Back pain   . Headache     Patient Active Problem List   Diagnosis Date Noted  . 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 Allergic rhinitis 06/22/2014  . Encounter for general adult medical examination without abnormal findings 06/22/2014  . HAND PAIN 11/27/2007    History reviewed. No pertinent surgical history.     Home Medications    Prior to Admission medications   Medication Sig Start Date End Date Taking? Authorizing Provider  fexofenadine (ALLEGRA) 180 MG tablet Take 180 mg by mouth. 05/13/14 09/06/16  [provider]  lisdexamfetamine (VYVANSE) 70 MG capsule Take 1 capsule (70 mg total) by mouth daily. DNFU 01/04/2017 11/29/16 12/29/16  Court Joy, PA-C  lisdexamfetamine (VYVANSE) 70 MG capsule Take 1 capsule (70 mg total) by mouth daily. 11/29/16   Court Joy, PA-C  lisdexamfetamine (VYVANSE) 70 MG capsule Take 1 capsule (70 mg total) by mouth daily. 11/29/16   Court Joy, PA-C    Family History Family History  Problem Relation Age of Onset  . ADD / ADHD Father   . Diabetes Father   . Cancer  Father   . ADD / ADHD Sister     Social History Social History   Tobacco Use  . Smoking status: Current Some Day Smoker    Packs/day: 0.50    Years: 8.00    Pack years: 4.00    Types: Cigarettes  . Smokeless tobacco: Former Neurosurgeon    Types: Snuff  . Tobacco comment: reduce # of cig  Substance Use Topics  . Alcohol use: Yes    Alcohol/week: 0.0 standard drinks    Comment: 1 beer a week  . Drug use: No     Allergies   Patient has no known allergies.   Review of Systems Review of Systems Pertinent negatives listed in HPI  Physical Exam Triage Vital Signs ED Triage Vitals  Enc Vitals Group     BP 04/13/19 1108 117/83     Pulse Rate 04/13/19 1108 70     Resp 04/13/19 1108 20     Temp 04/13/19 1108 97.8 F (36.6 C)     Temp Source 04/13/19 1108 Oral     SpO2 04/13/19 1108 98 %     Weight 04/13/19 1111 217 lb (98.4 kg)     Height 04/13/19 1111 5\' 7"  (1.702 m)     Head Circumference --      Peak Flow --      Pain Score 04/13/19 1110 7     Pain Loc --      Pain Edu? --      Excl. in [place]? --  No data found.  Updated Vital Signs BP 117/83 (BP Location: Right Arm)   Pulse 70   Temp 97.8 F (36.6 C) (Oral)   Resp 20   Ht 5\' 7"  (1.702 m)   Wt 217 lb (98.4 kg)   SpO2 98%   BMI 33.99 kg/m   Visual Acuity Right Eye Distance:   Left Eye Distance:   Bilateral Distance:    Right Eye Near:   Left Eye Near:    Bilateral Near:     Physical Exam General appearance: alert, acute distress (pain), cooperative  Head: Normocephalic, without obvious abnormality, atraumatic Respiratory: Respirations even and unlabored, normal respiratory rate Heart: rate and rhythm normal.  Musculoskeletal: Lumbosacral tenderness, decreased ROM-no edema and or palpable mass  Skin: Skin color, texture, turgor normal. No rashes seen  Psych: Appropriate mood and affect. Neurologic: Mental status: Alert, oriented to person, place, and time, thought content appropriate.  UC Treatments  / Results  Labs (all labs ordered are listed, but only abnormal results are displayed) Labs Reviewed - No data to display  EKG  Radiology No results found.  Procedures Procedures (including critical care time)  Medications Ordered in UC Medications - No data to display  Initial Impression / Assessment and Plan / UC Course  I have reviewed the triage vital signs and the nursing notes.  Pertinent labs & imaging results that were available during my care of the patient were reviewed by me and considered in my medical decision making (see chart for details).      Bilateral sciatica, acute, recurrent -Solu-Medrol 125 mg IM ordered here in clinic -Start prednisone 50 mg x 3 days to start  tomorrow -Naproxen 500 mg twice daily as needed as needed -Flexeril 10 mg twice daily as needed as needed caution given regarding drowsiness side effects. -Recommended follow-up with speciality if symptoms do not improve. Final Clinical Impressions(s) / UC Diagnoses   Final diagnoses:  Bilateral sciatica     Discharge Instructions     If symptoms persist or do not improve with current treatment follow-up with our sports medicine specialist at [facility] primary care and sports medicine next-door for further evaluation and work-up of symptoms.      ED Prescriptions    Medication Sig Dispense Auth. Provider   predniSONE (DELTASONE) 50 MG tablet Take 1 tablet (50 mg total) by mouth daily with breakfast for 3 days. 3 tablet Scot Jun, FNP   naproxen (NAPROSYN) 500 MG tablet  (Status: Discontinued) Take 1 tablet (500 mg total) by mouth 2 (two) times daily with a meal. 30 tablet Scot Jun, FNP   cyclobenzaprine (FLEXERIL) 10 MG tablet Take 1 tablet (10 mg total) by mouth 2 (two) times daily as needed for muscle spasms. 30 tablet Scot Jun, FNP   naproxen (NAPROSYN) 500 MG tablet Take 1 tablet (500 mg total) by mouth 2 (two) times daily with a meal. 30 tablet Scot Jun, FNP     PDMP not reviewed this encounter.   Scot Jun, FNP 04/14/19 1211    Scot Jun, FNP 04/14/19 [phone]

## 2021-07-31 ENCOUNTER — Encounter: Payer: Self-pay | Admitting: Emergency Medicine

## 2021-07-31 ENCOUNTER — Emergency Department
Admission: EM | Admit: 2021-07-31 | Discharge: 2021-07-31 | Disposition: A | Discharge status: Home / Self Care | Payer: BC Managed Care – PPO | Attending: Family Medicine | Admitting: Family Medicine

## 2021-07-31 DIAGNOSIS — S39012A Strain of muscle, fascia and tendon of lower back, initial encounter: Secondary | ICD-10-CM | POA: Diagnosis not present

## 2021-07-31 MED ORDER — KETOROLAC TROMETHAMINE 30 MG/ML IJ SOLN
30.0000 mg | Freq: Once | INTRAMUSCULAR | Status: AC
  Administered 2021-07-31: 30 mg via INTRAMUSCULAR

## 2021-07-31 MED ORDER — CYCLOBENZAPRINE HCL 10 MG PO TABS: 10.0000 mg | ORAL_TABLET | Freq: Two times a day (BID) | ORAL | 0 refills | Status: DC | PRN

## 2021-07-31 MED ORDER — DEXAMETHASONE SODIUM PHOSPHATE 10 MG/ML IJ SOLN
10.0000 mg | Freq: Once | INTRAMUSCULAR | Status: AC
  Administered 2021-07-31: 10 mg via INTRAMUSCULAR

## 2021-07-31 MED ORDER — PREDNISONE 20 MG PO TABS: 20.0000 mg | ORAL_TABLET | Freq: Every day | ORAL | 0 refills | Status: AC

## 2021-07-31 NOTE — Discharge Instructions (Addendum)
Start oral prednisone tomorrow. You may take the muscle relaxer twice daily as needed. Keep in mind muscle relaxer can cause drowsiness avoid taking when driving. You received Toradol and Decadron (steroid) injection here in clinic today.

## 2021-07-31 NOTE — ED Provider Notes (Signed)
[facility]    CSN: [id number] Arrival date & time: 07/31/21  1105      History   Chief Complaint Chief Complaint  Patient presents with   Back Pain    HPI Nicolas Cohen is a 31 y.o. male.   HPI Patient presents today with low back pain.  Over the weekend he report lifting heavy furniture and subsequently developed back pain. He reports pain is present with positional movements and with sitting. He has history of recurrent back pain. He has taken ibuprofen without relief of symptom.  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 No pertinent surgical history.     Home Medications    Prior to Admission medications   Medication Sig Start Date End Date Taking? Authorizing Provider  lisdexamfetamine (VYVANSE) 70 MG capsule Take 1 capsule (70 mg total) by mouth daily. 11/29/16  Yes Court Joy, PA-C  lisdexamfetamine (VYVANSE) 70 MG capsule Take 1 capsule (70 mg total) by mouth daily. 11/29/16  Yes Court Joy, PA-C  predniSONE (DELTASONE) 20 MG tablet Take 1 tablet (20 mg total) by mouth daily with breakfast for 5 days. 08/01/21 08/06/21 Yes Bing Neighbors, FNP  rosuvastatin (CRESTOR) 40 MG tablet Take 40 mg by mouth daily. 04/10/21  Yes [provider]  cyclobenzaprine (FLEXERIL) 10 MG tablet Take 1 tablet (10 mg total) by mouth 2 (two) times daily as needed for muscle spasms. 07/31/21   Bing Neighbors, FNP  fexofenadine (ALLEGRA) 180 MG tablet Take 180 mg by mouth. 05/13/14 09/06/16  [provider]  lisdexamfetamine (VYVANSE) 70 MG capsule Take 1 capsule (70 mg total) by mouth daily. DNFU 01/04/2017  11/29/16 12/29/16  Court Joy, PA-C    Family History Family History  Problem Relation Age of Onset   Healthy Mother    ADD / ADHD Father    Diabetes Father    Cancer Father    ADD / ADHD Sister     Social History Social History   Tobacco Use   Smoking status: Some Days    Packs/day: 0.50    Years: 8.00    Pack years: 4.00    Types: Cigarettes   Smokeless tobacco: Former    Types: Snuff   Tobacco comments:    reduce # of cig  Substance Use Topics   Alcohol use: Yes    Alcohol/week: 0.0 standard drinks    Comment: 1 beer a week   Drug use: No     Allergies   Patient has no known allergies.   Review of Systems Review of Systems Pertinent negatives listed in HPI   Physical Exam Triage Vital Signs ED Triage Vitals  Enc Vitals Group     BP 07/31/21 1227 (!) 144/81     Pulse Rate 07/31/21 1227 72     Resp 07/31/21 1227 18     Temp 07/31/21 1227 98.1 F (36.7 C)     Temp Source 07/31/21 1227 Oral     SpO2 07/31/21 1227 98 %     Weight 07/31/21 1228 200 lb (90.7 kg)     Height 07/31/21 1228  5\' 7"  (1.702 m)     Head Circumference --      Peak Flow --      Pain Score 07/31/21 1227 7     Pain Loc --      Pain Edu? --      Excl. in [place]? --    No data found.  Updated Vital Signs BP (!) 144/81 (BP Location: Left Arm)   Pulse 72   Temp 98.1 F (36.7 C) (Oral)   Resp 18   Ht 5\' 7"  (1.702 m)   Wt 200 lb (90.7 kg)   SpO2 98%   BMI 31.32 kg/m   Visual Acuity Right Eye Distance:   Left Eye Distance:   Bilateral Distance:    Right Eye Near:   Left Eye Near:    Bilateral Near:     Physical Exam Vitals reviewed.  Constitutional:      Appearance: Normal appearance.  Cardiovascular:     Rate and Rhythm: Normal rate and regular rhythm.  Pulmonary:     Effort: Pulmonary effort is normal.     Breath sounds: Normal breath sounds.  Musculoskeletal:     Lumbar back: Tenderness present. No spasms. Decreased range of motion.  Skin:    General: Skin  is warm.     Capillary Refill: Capillary refill takes less than 2 seconds.  Neurological:     Mental Status: He is alert.     UC Treatments / Results  Labs (all labs ordered are listed, but only abnormal results are displayed) Labs Reviewed - No data to display  EKG   Radiology No results found.  Procedures Procedures (including critical care time)  Medications Ordered in UC Medications  ketorolac (TORADOL) 30 MG/ML injection 30 mg (30 mg Intramuscular Given 07/31/21 1242)  dexamethasone (DECADRON) injection 10 mg (10 mg Intramuscular Given 07/31/21 1242)    Initial Impression / Assessment and Plan / UC Course  I have reviewed the triage vital signs and the nursing notes.  Pertinent labs & imaging results that were available during my care of the patient were reviewed by me and considered in my medical decision making (see chart for details).    Lumbar strain Toradol and Decadron IM given here in clinic  Start oral prednisone tomorrow Flexeril PRN  RTC PRN Final Clinical Impressions(s) / UC Diagnoses   Final diagnoses:  Strain of lumbar region, initial encounter     Discharge Instructions      Start oral prednisone tomorrow. You may take the muscle relaxer twice daily as needed. Keep in mind muscle relaxer can cause drowsiness avoid taking when driving. You received Toradol and Decadron (steroid) injection here in clinic today.     ED Prescriptions     Medication Sig Dispense Auth. Provider   cyclobenzaprine (FLEXERIL) 10 MG tablet Take 1 tablet (10 mg total) by mouth 2 (two) times daily as needed for muscle spasms. 30 tablet Scot Jun, FNP   predniSONE (DELTASONE) 20 MG tablet Take 1 tablet (20 mg total) by mouth daily with breakfast for 5 days. 5 tablet Scot Jun, FNP      PDMP not reviewed this encounter.   Scot Jun, FNP 07/31/21 1249

## 2021-07-31 NOTE — ED Triage Notes (Signed)
Patient states that he was moving some furniture on Saturday and injured his lower back.  Patient has taken Ibuprofen for pain w/o relief.

## 2021-11-20 ENCOUNTER — Ambulatory Visit
Admission: RE | Admit: 2021-11-20 | Discharge: 2021-11-20 | Disposition: A | Discharge status: Home / Self Care | Payer: BC Managed Care – PPO | Source: Ambulatory Visit | Attending: Family Medicine | Admitting: Family Medicine

## 2021-11-20 VITALS — BP 121/75 | HR 89 | Temp 99.2°F | Resp 18 | Ht 67.0 in | Wt 180.0 lb

## 2021-11-20 DIAGNOSIS — R109 Unspecified abdominal pain: Secondary | ICD-10-CM

## 2021-11-20 DIAGNOSIS — Z87442 Personal history of urinary calculi: Secondary | ICD-10-CM

## 2021-11-20 DIAGNOSIS — R319 Hematuria, unspecified: Secondary | ICD-10-CM

## 2021-11-20 DIAGNOSIS — Z841 Family history of disorders of kidney and ureter: Secondary | ICD-10-CM

## 2021-11-20 LAB — POCT URINALYSIS DIP (MANUAL ENTRY)
Bilirubin, UA: NEGATIVE
Glucose, UA: NEGATIVE mg/dL
Ketones, POC UA: NEGATIVE mg/dL
Leukocytes, UA: NEGATIVE
Nitrite, UA: NEGATIVE
Protein Ur, POC: NEGATIVE mg/dL
Spec Grav, UA: 1.02 (ref 1.010–1.025)
Urobilinogen, UA: 0.2 E.U./dL
pH, UA: 6 (ref 5.0–8.0)

## 2021-11-20 MED ORDER — TAMSULOSIN HCL 0.4 MG PO CAPS
0.4000 mg | ORAL_CAPSULE | Freq: Every day | ORAL | 1 refills | Status: AC
Start: 2021-11-20 — End: ?

## 2021-11-20 MED ORDER — HYDROCODONE-ACETAMINOPHEN 5-325 MG PO TABS: 1.0000 | ORAL_TABLET | Freq: Three times a day (TID) | ORAL | 0 refills | Status: AC | PRN

## 2021-11-20 MED ORDER — ONDANSETRON HCL 8 MG PO TABS: 8.0000 mg | ORAL_TABLET | Freq: Three times a day (TID) | ORAL | 0 refills | Status: AC | PRN

## 2021-11-20 NOTE — ED Provider Notes (Signed)
[name]    CSN: [id number] Arrival date & time: 11/20/21  1554      History   Chief Complaint Chief Complaint  Patient presents with   Abdominal Pain    Entered by patient   Appointment    HPI Nicolas Cohen is a 31 y.o. male.   HPI  Patient states that he has a history of multiple kidney stones.  His father also has multiple kidney stones.  He has captured a stone was told they were calcium stones.  His last stone was about a year ago.  Currently has pain in his left flank that is traveling around to the left middle abdomen.  Pain radiates down to the suprapubic area and testicle.  Nausea but no vomiting.  Severe pain that is coming and spasm.  No hematuria.  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 No pertinent surgical history.     Home Medications    Prior to Admission medications   Medication Sig Start Date End Date Taking? Authorizing Provider  HYDROcodone-acetaminophen (NORCO/VICODIN) 5-325 MG tablet Take 1-2 tablets by mouth every 8 (eight) hours as needed. 11/20/21  Yes Raylene Everts, MD  ondansetron (ZOFRAN) 8 MG tablet Take 1 tablet (8 mg total) by mouth every 8 (eight) hours as needed for nausea or vomiting. 11/20/21  Yes Raylene Everts, MD  rosuvastatin (CRESTOR) 40 MG tablet Take 40 mg by mouth daily. 04/10/21  Yes [provider]  tamsulosin (FLOMAX) 0.4 MG CAPS capsule Take 1 capsule (0.4 mg total) by mouth daily after supper. 11/20/21  Yes Raylene Everts, MD    Family History Family History  Problem Relation Age of Onset   Healthy Mother    ADD / ADHD  Father    Diabetes Father    Cancer Father    ADD / ADHD Sister     Social History Social History   Tobacco Use   Smoking status: Some Days    Packs/day: 0.50    Years: 8.00    Total pack years: 4.00    Types: Cigarettes   Smokeless tobacco: Former    Types: Snuff   Tobacco comments:    reduce # of cig  Substance Use Topics   Alcohol use: Yes    Alcohol/week: 0.0 standard drinks of alcohol    Comment: 1 beer a week   Drug use: No     Allergies   Patient has no known allergies.   Review of Systems Review of Systems  See HPI Physical Exam Triage Vital Signs ED Triage Vitals  Enc Vitals Group     BP 11/20/21 1603 121/75     Pulse Rate 11/20/21 1603 89     Resp 11/20/21 1603 18     Temp 11/20/21 1603 99.2 F (37.3 C)     Temp Source 11/20/21 1603 Oral     SpO2 11/20/21 1603 98 %     Weight 11/20/21 1604 180 lb (81.6 kg)     Height 11/20/21 1604 5\' 7"  (1.702 m)     Head Circumference --  Peak Flow --      Pain Score 11/20/21 1604 9     Pain Loc --      Pain Edu? --      Excl. in GC? --    No data found.  Updated Vital Signs BP 121/75 (BP Location: Right Arm)   Pulse 89   Temp 99.2 F (37.3 C) (Oral)   Resp 18   Ht 5\' 7"  (1.702 m)   Wt 81.6 kg   SpO2 98%   BMI 28.19 kg/m       Physical Exam Constitutional:      General: He is in acute distress.     Appearance: He is well-developed and normal weight.  HENT:     Head: Normocephalic and atraumatic.  Eyes:     Conjunctiva/sclera: Conjunctivae normal.     Pupils: Pupils are equal, round, and reactive to light.  Cardiovascular:     Rate and Rhythm: Normal rate.  Pulmonary:     Effort: Pulmonary effort is normal. No respiratory distress.  Abdominal:     General: Bowel sounds are normal. There is no distension.     Palpations: Abdomen is soft.     Tenderness: There is abdominal tenderness. There is no guarding or rebound.     Comments: Moderate tenderness in the left mid abdomen and left  lower quadrant.  No guarding or rebound.  No palpable mass  Musculoskeletal:        General: Normal range of motion.     Cervical back: Normal range of motion.  Skin:    General: Skin is warm and dry.  Neurological:     Mental Status: He is alert.      UC Treatments / Results  Labs (all labs ordered are listed, but only abnormal results are displayed) Labs Reviewed  POCT URINALYSIS DIP (MANUAL ENTRY) - Abnormal; Notable for the following components:      Result Value   Blood, UA trace-intact (*)    All other components within normal limits    EKG   Radiology No results found.  Procedures Procedures (including critical care time)  Medications Ordered in UC Medications - No data to display  Initial Impression / Assessment and Plan / UC Course  I have reviewed the triage vital signs and the nursing notes.  Pertinent labs & imaging results that were available during my care of the patient were reviewed by me and considered in my medical decision making (see chart for details).     Discussed kidney stones.  Complications.  Can try to hydrate at home and see if he passes this, however, if he gets worse instead of better at any time he must go the emergency room for imaging or ultrasound.  Discussed that intervention is sometimes indicated Final Clinical Impressions(s) / UC Diagnoses   Final diagnoses:  History of kidney stones  Family history of kidney stones  Acute left flank pain  Hematuria, unspecified type     Discharge Instructions      Continue drinking lots of water Take Flomax daily until symptoms improve Take Zofran for nausea Take hydrocodone as needed for severe pain Go to ER if worse at any time instead of better     ED Prescriptions     Medication Sig Dispense Auth. Provider   tamsulosin (FLOMAX) 0.4 MG CAPS capsule Take 1 capsule (0.4 mg total) by mouth daily after supper. 30 capsule , MD   ondansetron (ZOFRAN) 8 MG tablet  Take  1 tablet (8 mg total) by mouth every 8 (eight) hours as needed for nausea or vomiting. 20 tablet Eustace Moore, MD   HYDROcodone-acetaminophen (NORCO/VICODIN) 5-325 MG tablet Take 1-2 tablets by mouth every 8 (eight) hours as needed. 15 tablet Eustace Moore, MD      I have reviewed the PDMP during this encounter.   Eustace Moore, MD 11/20/21 [phone]

## 2021-11-20 NOTE — Discharge Instructions (Signed)
Continue drinking lots of water Take Flomax daily until symptoms improve Take Zofran for nausea Take hydrocodone as needed for severe pain Go to ER if worse at any time instead of better

## 2021-11-20 NOTE — ED Triage Notes (Signed)
Patient c/o possible kidney stone on his left side.  LUQ pain started this morning, some nausea, denies any hematuria.  Denies fever.

## 2021-11-21 ENCOUNTER — Telehealth: Payer: Self-pay | Admitting: Emergency Medicine

## 2021-11-21 NOTE — Telephone Encounter (Signed)
[name].  Advised if doing well, he can disregard the call.  Any questions or concerns, feel free to give the office a call back.

## 2022-08-02 ENCOUNTER — Ambulatory Visit
Admission: EM | Admit: 2022-08-02 | Discharge: 2022-08-02 | Disposition: A | Discharge status: Home / Self Care | Payer: Managed Care, Other (non HMO)

## 2022-08-02 DIAGNOSIS — J40 Bronchitis, not specified as acute or chronic: Secondary | ICD-10-CM | POA: Diagnosis not present

## 2022-08-02 DIAGNOSIS — J329 Chronic sinusitis, unspecified: Secondary | ICD-10-CM

## 2022-08-02 DIAGNOSIS — R059 Cough, unspecified: Secondary | ICD-10-CM | POA: Diagnosis not present

## 2022-08-02 MED ORDER — PROMETHAZINE-DM 6.25-15 MG/5ML PO SYRP: 5.0000 mL | ORAL_SOLUTION | Freq: Two times a day (BID) | ORAL | 0 refills | Status: DC | PRN

## 2022-08-02 MED ORDER — DOXYCYCLINE HYCLATE 100 MG PO CAPS: 100.0000 mg | ORAL_CAPSULE | Freq: Two times a day (BID) | ORAL | 0 refills | Status: AC

## 2022-08-02 MED ORDER — BENZONATATE 200 MG PO CAPS: 200.0000 mg | ORAL_CAPSULE | Freq: Three times a day (TID) | ORAL | 0 refills | Status: AC | PRN

## 2022-08-02 MED ORDER — PREDNISONE 10 MG (21) PO TBPK
ORAL_TABLET | Freq: Every day | ORAL | 0 refills | Status: DC
Start: 2022-08-02 — End: 2023-03-27

## 2022-08-02 NOTE — ED Provider Notes (Signed)
[facility]    CSN: [id number] Arrival date & time: 08/02/22  1540      History   Chief Complaint Chief Complaint  Patient presents with   Cough    HPI Nicolas Cohen is a 32 y.o. male.   HPI Pleasant 32 year old male presents with productive cough low-grade fever and chest tightness for 1 week.  Reports history of bronchitis.  It has been treating with DayQuil and Ibuprofen.  PMH significant for asthma, infrequent cigarette smoker, ADHD, and HLD.  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 No pertinent surgical history.     Home Medications    Prior to Admission medications   Medication Sig Start Date End Date Taking? Authorizing Provider  benzonatate (TESSALON) 200 MG capsule Take 1 capsule (200 mg total) by mouth 3 (three) times daily as needed for up to 7 days. 08/02/22 08/09/22 Yes Trevor Iha, FNP  doxycycline (VIBRAMYCIN) 100 MG capsule Take 1 capsule (100 mg total) by mouth 2 (two) times daily for 10 days. 08/02/22 08/12/22 Yes Trevor Iha, FNP  predniSONE (STERAPRED UNI-PAK 21 TAB) 10 MG (21) TBPK tablet Take by mouth daily. Take 6 tabs by mouth daily  for 2 days, then 5 tabs for 2 days, then 4 tabs for 2 days, then 3 tabs for 2 days, 2 tabs for 2 days, then 1 tab by mouth daily for 2 days 08/02/22  Yes Trevor Iha, FNP  promethazine-dextromethorphan (PROMETHAZINE-DM) 6.25-15 MG/5ML syrup Take 5 mLs by mouth 2 (two) times daily as needed for cough. 08/02/22  Yes Trevor Iha, FNP  HYDROcodone-acetaminophen (NORCO/VICODIN) 5-325 MG tablet Take 1-2 tablets by mouth every 8 (eight) hours as needed. 11/20/21   Eustace Moore, MD  lisdexamfetamine (VYVANSE) 70 MG capsule Take 70 mg by mouth daily.    [provider]  ondansetron (ZOFRAN) 8 MG tablet Take 1 tablet (8 mg total) by mouth every 8 (eight) hours as needed for nausea or vomiting. 11/20/21   Eustace Moore, MD  rosuvastatin (CRESTOR) 40 MG tablet Take 40 mg by mouth daily. 04/10/21   [provider]  tamsulosin (FLOMAX) 0.4 MG CAPS capsule Take 1 capsule (0.4 mg total) by mouth daily after supper. 11/20/21   Eustace Moore, MD    Family History Family History  Problem Relation Age of Onset   Healthy Mother    ADD / ADHD Father    Diabetes Father    Cancer Father    ADD / ADHD Sister     Social History Social History   Tobacco Use   Smoking status: Some Days    Packs/day: 0.50    Years: 8.00    Additional pack years: 0.00    Total pack years: 4.00    Types: Cigarettes   Smokeless tobacco: Former    Types: Snuff   Tobacco comments:    reduce # of cig  Substance Use Topics   Alcohol use: Yes    Alcohol/week: 0.0 standard drinks of alcohol    Comment: 1 beer a week   Drug use: No     Allergies   Patient  has no known allergies.   Review of Systems Review of Systems  HENT:  Positive for congestion.   Respiratory:  Positive for cough and chest tightness.   All other systems reviewed and are negative.    Physical Exam Triage Vital Signs ED Triage Vitals  Enc Vitals Group     BP 08/02/22 1650 125/84     Pulse Rate 08/02/22 1650 79     Resp 08/02/22 1650 16     Temp 08/02/22 1650 97.7 F (36.5 C)     Temp Source 08/02/22 1650 Oral     SpO2 08/02/22 1650 97 %     Weight --      Height --      Head Circumference --      Peak Flow --      Pain Score 08/02/22 1652 0     Pain Loc --      Pain Edu? --      Excl. in GC? --    No data found.  Updated Vital Signs BP 125/84 (BP Location: Left Arm)   Pulse 79   Temp 97.7 F (36.5 C) (Oral)   Resp 16   SpO2 97%       Physical  Exam Vitals and nursing note reviewed.  Constitutional:      Appearance: Normal appearance. He is normal weight. He is ill-appearing.  HENT:     Head: Normocephalic and atraumatic.     Right Ear: Tympanic membrane, ear canal and external ear normal.     Left Ear: Tympanic membrane, ear canal and external ear normal.     Mouth/Throat:     Mouth: Mucous membranes are moist.     Pharynx: Oropharynx is clear.  Eyes:     Extraocular Movements: Extraocular movements intact.     Conjunctiva/sclera: Conjunctivae normal.     Pupils: Pupils are equal, round, and reactive to light.  Cardiovascular:     Rate and Rhythm: Normal rate and regular rhythm.     Pulses: Normal pulses.     Heart sounds: Normal heart sounds.  Pulmonary:     Effort: Pulmonary effort is normal.     Breath sounds: Rhonchi present. No wheezing or rales.     Comments: Mild diffuse scattered rhonchi noted throughout with infrequent nonproductive cough Musculoskeletal:        General: Normal range of motion.     Cervical back: Normal range of motion and neck supple.  Skin:    General: Skin is warm and dry.  Neurological:     General: No focal deficit present.     Mental Status: He is alert and oriented to person, place, and time. Mental status is at baseline.  Psychiatric:        Mood and Affect: Mood normal.        Behavior: Behavior normal.      UC Treatments / Results  Labs (all labs ordered are listed, but only abnormal results are displayed) Labs Reviewed - No data to display  EKG   Radiology No results found.  Procedures Procedures (including critical care time)  Medications Ordered in UC Medications - No data to display  Initial Impression / Assessment and Plan / UC Course  I have reviewed the triage vital signs and the nursing notes.  Pertinent labs & imaging results that were available during my care of the patient were reviewed by me and considered in my medical decision making (see chart for  details).  MDM: 1.  Sinobronchitis-Rx'd doxycycline 100 mg capsule twice daily x 10 days, Sterapred Unipak (tapering from 60 mg to 10 mg over 10 days); 2.  Cough, unspecified type-Rx'd Tessalon Perles 200 mg 3 times daily, as needed, Promethazine DM 6.25-15 mg / 5 mL syrup: Take 5 mL twice daily for cough, as needed. Instructed patient to take medication as directed with food to completion.  Advised patient to take prednisone with first dose of doxycycline for the next 10 days.  Advised may use Tessalon pearls daily or as needed for cough.  Advised may use Promethazine DM at night for cough due to sedative effects.  Advised patient not to use cough medications together.  Encouraged increase daily water intake to 64 ounces per day while taking these medications.  Advised if symptoms worsen and/or unresolved please follow-up PCP or here for further evaluation.   Final Clinical Impressions(s) / UC Diagnoses   Final diagnoses:  Cough, unspecified type  Sinobronchitis     Discharge Instructions      Instructed patient to take medication as directed with food to completion.  Advised patient to take prednisone with first dose of doxycycline for the next 10 days.  Advised may use Tessalon pearls daily or as needed for cough.  Advised may use Promethazine DM at night for cough due to sedative effects.  Advised patient not to use cough medications together.  Encouraged increase daily water intake to 64 ounces per day while taking these medications.  Advised if symptoms worsen and/or unresolved please follow-up PCP or here for further evaluation.     ED Prescriptions     Medication Sig Dispense Auth. Provider   doxycycline (VIBRAMYCIN) 100 MG capsule Take 1 capsule (100 mg total) by mouth 2 (two) times daily for 10 days. 20 capsule Trevor Iha, FNP   predniSONE (STERAPRED UNI-PAK 21 TAB) 10 MG (21) TBPK tablet Take by mouth daily. Take 6 tabs by mouth daily  for 2 days, then 5 tabs for 2 days,  then 4 tabs for 2 days, then 3 tabs for 2 days, 2 tabs for 2 days, then 1 tab by mouth daily for 2 days 42 tablet Trevor Iha, FNP   benzonatate (TESSALON) 200 MG capsule Take 1 capsule (200 mg total) by mouth 3 (three) times daily as needed for up to 7 days. 40 capsule Trevor Iha, FNP   promethazine-dextromethorphan (PROMETHAZINE-DM) 6.25-15 MG/5ML syrup Take 5 mLs by mouth 2 (two) times daily as needed for cough. 118 mL Trevor Iha, FNP      PDMP not reviewed this encounter.   Juventino, Gohman, FNP 08/02/22 1739

## 2022-08-02 NOTE — Discharge Instructions (Addendum)
Instructed patient to take medication as directed with food to completion.  Advised patient to take prednisone with first dose of doxycycline for the next 10 days.  Advised may use Tessalon pearls daily or as needed for cough.  Advised may use Promethazine DM at night for cough due to sedative effects.  Advised patient not to use cough medications together.  Encouraged increase daily water intake to 64 ounces per day while taking these medications.  Advised if symptoms worsen and/or unresolved please follow-up PCP or here for further evaluation.

## 2022-08-02 NOTE — ED Triage Notes (Signed)
Patient presents to UC for productive cough, low grade fever, and chest tightness x 1 week. States the phlegm is yellowish in color, hx of bronchitis. Treating with dayquil and ibuprofen.

## 2023-03-27 ENCOUNTER — Ambulatory Visit
Admission: EM | Admit: 2023-03-27 | Discharge: 2023-03-27 | Disposition: A | Discharge status: Home / Self Care | Payer: Managed Care, Other (non HMO) | Attending: Family Medicine | Admitting: Family Medicine

## 2023-03-27 DIAGNOSIS — R059 Cough, unspecified: Secondary | ICD-10-CM

## 2023-03-27 DIAGNOSIS — J101 Influenza due to other identified influenza virus with other respiratory manifestations: Secondary | ICD-10-CM

## 2023-03-27 DIAGNOSIS — R509 Fever, unspecified: Secondary | ICD-10-CM

## 2023-03-27 LAB — POC COVID19/FLU A&B COMBO
Covid Antigen, POC: NEGATIVE
Influenza A Antigen, POC: POSITIVE — AB
Influenza B Antigen, POC: NEGATIVE

## 2023-03-27 MED ORDER — BENZONATATE 200 MG PO CAPS: 200.0000 mg | ORAL_CAPSULE | Freq: Three times a day (TID) | ORAL | 0 refills | Status: AC | PRN

## 2023-03-27 MED ORDER — OSELTAMIVIR PHOSPHATE 75 MG PO CAPS: 75.0000 mg | ORAL_CAPSULE | Freq: Two times a day (BID) | ORAL | 0 refills | Status: AC

## 2023-03-27 MED ORDER — PROMETHAZINE-DM 6.25-15 MG/5ML PO SYRP: 5.0000 mL | ORAL_SOLUTION | Freq: Two times a day (BID) | ORAL | 0 refills | Status: AC | PRN

## 2023-03-27 NOTE — ED Provider Notes (Signed)
[facility]    CSN: [id number] Arrival date & time: 03/27/23  1216      History   Chief Complaint Chief Complaint  Patient presents with   Fever   Generalized Body Aches    HPI Nicolas Cohen is a 33 y.o. male.   HPI 33 year old male presents with fever and generalized bodyaches since yesterday.  Reports temperature max of 101.0 last night.  PMH significant for ADHD and lumbar radiculopathy.  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 No pertinent surgical history.     Home Medications    Prior to Admission medications   Medication Sig Start Date End Date Taking? Authorizing Provider  benzonatate (TESSALON) 200 MG capsule Take 1 capsule (200 mg total) by mouth 3 (three) times daily as needed for up to 7 days. 03/27/23 04/03/23 Yes Trevor Iha, FNP  oseltamivir (TAMIFLU) 75 MG capsule Take 1 capsule (75 mg total) by mouth every 12 (twelve) hours. 03/27/23  Yes Trevor Iha, FNP  promethazine-dextromethorphan (PROMETHAZINE-DM) 6.25-15 MG/5ML syrup Take 5 mLs by mouth 2 (two) times daily as needed for cough. 03/27/23  Yes Trevor Iha, FNP  HYDROcodone-acetaminophen (NORCO/VICODIN) 5-325 MG tablet Take 1-2 tablets by mouth every 8 (eight) hours as needed. 11/20/21   Eustace Moore, MD  lisdexamfetamine (VYVANSE) 70 MG capsule Take 70 mg by mouth daily.    [provider]  ondansetron (ZOFRAN) 8 MG tablet Take 1 tablet (8 mg total) by mouth every 8 (eight) hours as needed for nausea or vomiting. 11/20/21   Eustace Moore, MD  rosuvastatin (CRESTOR) 40 MG tablet Take 40 mg by mouth daily.  04/10/21   [provider]  tamsulosin (FLOMAX) 0.4 MG CAPS capsule Take 1 capsule (0.4 mg total) by mouth daily after supper. 11/20/21   Eustace Moore, MD    Family History Family History  Problem Relation Age of Onset   Healthy Mother    ADD / ADHD Father    Diabetes Father    Cancer Father    ADD / ADHD Sister     Social History Social History   Tobacco Use   Smoking status: Some Days    Current packs/day: 0.50    Average packs/day: 0.5 packs/day for 8.0 years (4.0 ttl pk-yrs)    Types: Cigarettes   Smokeless tobacco: Former    Types: Snuff   Tobacco comments:    reduce # of cig  Substance Use Topics   Alcohol use: Yes    Alcohol/week: 0.0 standard drinks of alcohol    Comment: 1 beer a week   Drug use: No     Allergies   Patient has no known allergies.   Review of Systems Review of Systems  Constitutional:  Positive for fever.  HENT:  Positive for postnasal drip, rhinorrhea and sore throat.   Musculoskeletal:  Positive for arthralgias and myalgias.  All other systems reviewed and are negative.    Physical Exam Triage Vital Signs ED Triage Vitals  Encounter Vitals Group     BP 03/27/23 1234 122/81     Systolic BP Percentile --  Diastolic BP Percentile --      Pulse Rate 03/27/23 1234 99     Resp --      Temp 03/27/23 1234 98.7 F (37.1 C)     Temp Source 03/27/23 1234 Oral     SpO2 03/27/23 1234 97 %     Weight --      Height --      Head Circumference --      Peak Flow --      Pain Score 03/27/23 1235 0     Pain Loc --      Pain Education --      Exclude from Growth Chart --    No data found.  Updated Vital Signs BP 122/81 (BP Location: Right Arm)   Pulse 99   Temp 98.7 F (37.1 C) (Oral)   SpO2 97%    Physical Exam Vitals and nursing note reviewed.  Constitutional:      Appearance: He is normal weight.  HENT:     Head: Normocephalic and atraumatic.     Right Ear: Tympanic membrane, ear canal and external ear  normal.     Left Ear: Tympanic membrane, ear canal and external ear normal.     Mouth/Throat:     Mouth: Mucous membranes are moist.     Pharynx: Oropharynx is clear.  Eyes:     Extraocular Movements: Extraocular movements intact.     Conjunctiva/sclera: Conjunctivae normal.     Pupils: Pupils are equal, round, and reactive to light.  Cardiovascular:     Rate and Rhythm: Normal rate and regular rhythm.     Pulses: Normal pulses.     Heart sounds: Normal heart sounds.  Pulmonary:     Effort: Pulmonary effort is normal.     Breath sounds: Normal breath sounds. No wheezing, rhonchi or rales.     Comments: Infrequent nonproductive cough on exam Musculoskeletal:        General: Normal range of motion.     Cervical back: Normal range of motion and neck supple.  Skin:    General: Skin is warm and dry.  Neurological:     General: No focal deficit present.     Mental Status: He is alert and oriented to person, place, and time. Mental status is at baseline.      UC Treatments / Results  Labs (all labs ordered are listed, but only abnormal results are displayed) Labs Reviewed  POC COVID19/FLU A&B COMBO - Abnormal; Notable for the following components:      Result Value   Influenza A Antigen, POC Positive (*)    All other components within normal limits    EKG   Radiology No results found.  Procedures Procedures (including critical care time)  Medications Ordered in UC Medications - No data to display  Initial Impression / Assessment and Plan / UC Course  I have reviewed the triage vital signs and the nursing notes.  Pertinent labs & imaging results that were available during my care of the patient were reviewed by me and considered in my medical decision making (see chart for details).     MDM: 1.  Influenza A-Rx'd Tamiflu 75 mg capsule twice daily x 5 days; 2.  Tessalon 200 mg capsules: Take 1 capsule 3 times daily, as needed for cough, Rx'd Promethazine DM 6.25-15  mg/5 mL syrup: Take 5 mL every 6 hours for cough, as needed.  3.  Fever, unspecified-Advised patient may take OTC Tylenol 1 g every 6  hours for fever (oral temperature greater than 100.3). Advised patient to take medication as directed with food to completion.  Advised may take Tessalon capsules daily or as needed for cough.  Advised may take Promethazine DM at night for cough prior to sleep due to sedative effects.  Encouraged to increase daily water intake to 64 ounces per day while taking these medications.  Advised if symptoms worsen and/or unresolved please follow-up with PCP or here for further evaluation.  Discharged home, hemodynamically stable.  Work note provided to patient prior to discharge per request Final Clinical Impressions(s) / UC Diagnoses   Final diagnoses:  Influenza A  Cough, unspecified type  Fever, unspecified     Discharge Instructions      Advised patient to take medication as directed with food to completion.  Advised may take Tessalon capsules daily or as needed for cough.  Advised may take Promethazine DM at night for cough prior to sleep due to sedative effects.  Advised patient may take OTC Tylenol 1 g every 6 hours for fever (oral temperature greater than 100.3).  Encouraged to increase daily water intake to 64 ounces per day while taking these medications.  Advised if symptoms worsen and/or unresolved please follow-up with PCP or here for further evaluation.     ED Prescriptions     Medication Sig Dispense Auth. Provider   oseltamivir (TAMIFLU) 75 MG capsule Take 1 capsule (75 mg total) by mouth every 12 (twelve) hours. 10 capsule Trevor Iha, FNP   benzonatate (TESSALON) 200 MG capsule Take 1 capsule (200 mg total) by mouth 3 (three) times daily as needed for up to 7 days. 40 capsule Trevor Iha, FNP   promethazine-dextromethorphan (PROMETHAZINE-DM) 6.25-15 MG/5ML syrup Take 5 mLs by mouth 2 (two) times daily as needed for cough. 118 mL Trevor Iha,  FNP      PDMP not reviewed this encounter.   Daequan, Kozma, FNP 03/27/23 1347

## 2023-03-27 NOTE — ED Triage Notes (Signed)
Pt c/o runny nose, fever and bodyaches since yesterday. Tmax fever of 101 last night. Taking ibuprofen prn. Last dose 4 hours ago.

## 2023-03-27 NOTE — Discharge Instructions (Addendum)
Advised patient to take medication as directed with food to completion.  Advised may take Tessalon capsules daily or as needed for cough.  Advised may take Promethazine DM at night for cough prior to sleep due to sedative effects.  Advised patient may take OTC Tylenol 1 g every 6 hours for fever (oral temperature greater than 100.3).  Encouraged to increase daily water intake to 64 ounces per day while taking these medications.  Advised if symptoms worsen and/or unresolved please follow-up with PCP or here for further evaluation.

## 2024-01-03 ENCOUNTER — Ambulatory Visit

## 2024-01-03 ENCOUNTER — Ambulatory Visit: Payer: Self-pay | Admitting: Internal Medicine

## 2024-01-03 ENCOUNTER — Ambulatory Visit
Admission: EM | Admit: 2024-01-03 | Discharge: 2024-01-03 | Disposition: A | Discharge status: Home / Self Care | Attending: Internal Medicine | Admitting: Internal Medicine

## 2024-01-03 ENCOUNTER — Telehealth: Payer: Self-pay | Admitting: Internal Medicine

## 2024-01-03 DIAGNOSIS — M79644 Pain in right finger(s): Secondary | ICD-10-CM

## 2024-01-03 DIAGNOSIS — L03011 Cellulitis of right finger: Secondary | ICD-10-CM

## 2024-01-03 MED ORDER — CEPHALEXIN 500 MG PO CAPS
500.0000 mg | ORAL_CAPSULE | Freq: Four times a day (QID) | ORAL | 0 refills | Status: AC
Start: 2024-01-03 — End: ?

## 2024-01-03 NOTE — ED Provider Notes (Signed)
 [facility]    CSN: [id number] Arrival date & time: 01/03/24  1059      History   Chief Complaint Chief Complaint  Patient presents with   Finger Problem    RT middle    HPI Nicolas Cohen is a 33 y.o. male.   Patient presents with swelling, pain, and redness to distal end of right third digit that started about 5 days ago. He states that it started after he stuck his finger in his pocket and seemed to get something stuck in his finger.  He is not sure what it was.  He attempted to remove foreign body at home, and it appeared to be some type of wood.  He is not sure if everything was removed.  Redness and swelling has continued to worsen since injury occurred.  Denies drainage.  Denies fever.  He has been soaking finger with minimal improvement.     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 No pertinent surgical history.     Home Medications    Prior to Admission medications   Medication Sig Start Date End Date Taking? Authorizing Provider  cephALEXin (KEFLEX) 500 MG capsule Take 1 capsule (500 mg total) by mouth 4 (four) times daily. 01/03/24  Yes Jannae Fagerstrom, Darryle BRAVO, FNP  HYDROcodone -acetaminophen  (NORCO/VICODIN) 5-325 MG tablet Take 1-2 tablets by mouth every 8 (eight) hours as needed. 11/20/21   Maranda Jamee Jacob, MD  lisdexamfetamine (VYVANSE ) 70 MG capsule Take 70 mg by mouth daily.    [provider]  ondansetron  (ZOFRAN ) 8 MG tablet Take 1 tablet (8 mg total) by mouth every 8 (eight) hours as needed for nausea or vomiting. 11/20/21   Maranda Jamee Jacob, MD  oseltamivir  (TAMIFLU ) 75 MG capsule  Take 1 capsule (75 mg total) by mouth every 12 (twelve) hours. 03/27/23   Teddy Ozell, FNP  promethazine -dextromethorphan (PROMETHAZINE -DM) 6.25-15 MG/5ML syrup Take 5 mLs by mouth 2 (two) times daily as needed for cough. 03/27/23   Teddy Ozell, FNP  rosuvastatin (CRESTOR) 40 MG tablet Take 40 mg by mouth daily. 04/10/21   [provider]  tamsulosin  (FLOMAX ) 0.4 MG CAPS capsule Take 1 capsule (0.4 mg total) by mouth daily after supper. 11/20/21   Maranda Jamee Jacob, MD    Family History Family History  Problem Relation Age of Onset   Healthy Mother    ADD / ADHD Father    Diabetes Father    Cancer Father    ADD / ADHD Sister     Social History Social History   Tobacco Use   Smoking status: Some Days    Current packs/day: 0.50    Average packs/day: 0.5 packs/day for 8.0 years (4.0 ttl pk-yrs)    Types: Cigarettes   Smokeless tobacco: Former    Types: Snuff   Tobacco comments:    reduce # of cig  Substance Use Topics   Alcohol use: Yes    Alcohol/week: 0.0 standard drinks of alcohol    Comment: 1 beer a week   Drug use: No     Allergies   Patient has no known allergies.  Review of Systems Review of Systems Per HPI  Physical Exam Triage Vital Signs ED Triage Vitals  Encounter Vitals Group     BP 01/03/24 1112 (!) 142/82     Girls Systolic BP Percentile --      Girls Diastolic BP Percentile --      Boys Systolic BP Percentile --      Boys Diastolic BP Percentile --      Pulse Rate 01/03/24 1112 85     Resp 01/03/24 1112 17     Temp 01/03/24 1112 98 F (36.7 C)     Temp Source 01/03/24 1112 Oral     SpO2 01/03/24 1112 96 %     Weight --      Height --      Head Circumference --      Peak Flow --      Pain Score 01/03/24 1113 7     Pain Loc --      Pain Education --      Exclude from Growth Chart --    No data found.  Updated Vital Signs BP (!) 142/82 (BP Location: Right Arm)   Pulse 85   Temp 98 F (36.7 C) (Oral)   Resp 17   SpO2  96%   Visual Acuity Right Eye Distance:   Left Eye Distance:   Bilateral Distance:    Right Eye Near:   Left Eye Near:    Bilateral Near:     Physical Exam Constitutional:      General: He is not in acute distress.    Appearance: Normal appearance. He is not toxic-appearing or diaphoretic.  HENT:     Head: Normocephalic and atraumatic.  Eyes:     Extraocular Movements: Extraocular movements intact.     Conjunctiva/sclera: Conjunctivae normal.  Pulmonary:     Effort: Pulmonary effort is normal.  Skin:    Comments: Redness and mild swelling present to distal end of right third digit with majority of inflammation noted to medial side of finger.  Nail is intact with no involvement.  Range of motion is minimally limited due to swelling.  Neurovascularly intact.  0.5 cm mild superficial, linear puncture wound present to medial finger.  No palpable foreign body noted.  No drainage.  Neurological:     General: No focal deficit present.     Mental Status: He is alert and oriented to person, place, and time. Mental status is at baseline.  Psychiatric:        Mood and Affect: Mood normal.        Behavior: Behavior normal.        Thought Content: Thought content normal.        Judgment: Judgment normal.      UC Treatments / Results  Labs (all labs ordered are listed, but only abnormal results are displayed) Labs Reviewed - No data to display  EKG   Radiology DG Finger Middle Right Result Date: 01/03/2024 EXAM: 3 VIEW(S) XRAY OF THE RIGHT FINGER 01/03/2024 11:37:03 AM COMPARISON: None available. CLINICAL HISTORY: Finger pain, infection, foreign body. FINDINGS: BONES AND JOINTS: No acute fracture. No focal osseous lesion. No joint dislocation. SOFT TISSUES: Mild diffuse soft tissue swelling is noted suggesting cellulitis. IMPRESSION: 1. Mild diffuse soft tissue swelling, compatible with cellulitis. Electronically signed by: Lynwood Seip MD 01/03/2024 12:45 PM EST RP Workstation:  [id number]    Procedures Procedures (including critical care time)  Medications Ordered in UC Medications - No data to display  Initial  Impression / Assessment and Plan / UC Course  I have reviewed the triage vital signs and the nursing notes.  Pertinent labs & imaging results that were available during my care of the patient were reviewed by me and considered in my medical decision making (see chart for details).     X-ray imaging was completed to determine if there was remaining foreign body present and to ensure septic joint is not present.  I did discuss with patient that imaging for foreign body would be limited given foreign body may not be radiopaque.  He verbalized understanding of this.  Imaging was negative for any acute foreign body, fracture, bony abnormality.  It did show some concerns for cellulitis which is consistent with physical exam.  Therefore, will opt to treat with cephalexin.  Patient was encouraged to monitor closely at home.  Also recommended warm Epsom salt soaks in case foreign body is still present as this will help work it to the surface.  Recommended follow-up with hand specialist at provided contact information if symptoms persist or worsen.  Patient verbalized understanding and was agreeable with plan. Final Clinical Impressions(s) / UC Diagnoses   Final diagnoses:  Finger pain, right     Discharge Instructions      I have prescribed you an antibiotic for concern for an infection of your finger.  I will call if x-ray is abnormal.  Please follow-up with hand specialist if symptoms persist or worsen.     ED Prescriptions     Medication Sig Dispense Auth. Provider   cephALEXin (KEFLEX) 500 MG capsule Take 1 capsule (500 mg total) by mouth 4 (four) times daily. 28 capsule [facility], Bailen Geffre E, [place]      PDMP not reviewed this encounter.   Hazen Darryle BRAVO, [place] 01/03/24 1321

## 2024-01-03 NOTE — Telephone Encounter (Signed)
 Attempted to call patient and spouse to discuss x-ray results. There was no answer. LVM for a call back per privacy policy.

## 2024-01-03 NOTE — ED Triage Notes (Signed)
 Pt c/o RT middle finger pain and swelling and redness x 5 days. Says he was pricked by something in his pocket causing a slice in his finger. Soaked in peroxide. Redness starting to spread down finger towards hand.

## 2024-01-03 NOTE — Discharge Instructions (Signed)
 I have prescribed you an antibiotic for concern for an infection of your finger.  I will call if x-ray is abnormal.  Please follow-up with hand specialist if symptoms persist or worsen.
